# Patient Record
Sex: Male | Born: 1995 | Race: White | Hispanic: No | Marital: Single | Smoking: Never smoker
Health system: Southern US, Community
[De-identification: ages and names within clinical notes are randomized; demographics above are authoritative.]

## PROBLEM LIST (undated history)

## (undated) DIAGNOSIS — I10 Essential (primary) hypertension: Secondary | ICD-10-CM

## (undated) HISTORY — PX: TONSILLECTOMY: SUR1361

## (undated) HISTORY — DX: Essential (primary) hypertension: I10

---
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
## 2021-06-12 ENCOUNTER — Ambulatory Visit: Payer: No Typology Code available for payment source | Admitting: Internal Medicine

## 2021-08-30 ENCOUNTER — Ambulatory Visit: Payer: No Typology Code available for payment source | Admitting: Internal Medicine

## 2021-09-20 ENCOUNTER — Encounter: Payer: Self-pay | Admitting: Internal Medicine

## 2021-09-20 ENCOUNTER — Ambulatory Visit (INDEPENDENT_AMBULATORY_CARE_PROVIDER_SITE_OTHER): Payer: Commercial Managed Care - PPO | Admitting: Internal Medicine

## 2021-09-20 VITALS — BP 156/90 | HR 81 | Resp 18 | Ht 70.0 in | Wt 256.6 lb

## 2021-09-20 DIAGNOSIS — Z1159 Encounter for screening for other viral diseases: Secondary | ICD-10-CM

## 2021-09-20 DIAGNOSIS — E669 Obesity, unspecified: Secondary | ICD-10-CM

## 2021-09-20 DIAGNOSIS — Z0000 Encounter for general adult medical examination without abnormal findings: Secondary | ICD-10-CM

## 2021-09-20 DIAGNOSIS — Z114 Encounter for screening for human immunodeficiency virus [HIV]: Secondary | ICD-10-CM

## 2021-09-20 DIAGNOSIS — I10 Essential (primary) hypertension: Secondary | ICD-10-CM

## 2021-09-20 MED ORDER — LISINOPRIL 20 MG PO TABS: 20.0000 mg | ORAL_TABLET | Freq: Every day | ORAL | 3 refills | Status: DC

## 2021-09-20 NOTE — Progress Notes (Signed)
New Patient Office Visit  Subjective:  Patient ID: Frank Carson, male    DOB: September 06, 1995  Age: 26 y.o. MRN: [id number]  CC:  Chief Complaint  Patient presents with   New Patient (Initial Visit)    New patient has seen dr hall one time just establishing care     HPI Frank Carson is a 26 y.o. male with past medical history of HTN and obesity who presents for establishing care.  HTN: His BP was elevated in the office today. He takes Lisinopril 10 mg QD.  He denies any headache, dizziness, chest pain, dyspnea or palpitations.  He attributes stress to his uncontrolled HTN.  He had a baby around 2 months ago.  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  He also reports taking more than 1 soft drink per day and admits that he needs to cut down.  He has alcohol on weekends only, up to 5 drinks at a time. He has had recent blood tests at his workplace.       Past Medical History:  Diagnosis Date   Hypertension     Past Surgical History:  Procedure Laterality Date   TONSILLECTOMY      Family History  Problem Relation Age of Onset   Hypertension Father    Hypertension Paternal Grandfather    Heart failure Paternal Grandfather     Social History   Socioeconomic History   Marital status: Single    Spouse name: Not on file   Number of children: Not on file   Years of education: Not on file   Highest education level: Not on file  Occupational History   Not on file  Tobacco Use   Smoking status: Never   Smokeless tobacco: Not on file  Substance and Sexual Activity   Alcohol use: No   Drug use: Not on file   Sexual activity: Not on file  Other Topics Concern   Not on file  Social History Narrative   Not on file   Social Determinants of Health   Financial Resource Strain: Not on file  Food Insecurity: Not on file  Transportation Needs: Not on file  Physical Activity: Not on file  Stress: Not on file  Social  Connections: Not on file  Intimate Partner Violence: Not on file    ROS Review of Systems  Constitutional:  Negative for chills and fever.  HENT:  Negative for congestion and sore throat.   Eyes:  Negative for pain and discharge.  Respiratory:  Negative for cough and shortness of breath.   Cardiovascular:  Negative for chest pain and palpitations.  Gastrointestinal:  Negative for constipation, diarrhea, nausea and vomiting.  Endocrine: Negative for polydipsia and polyuria.  Genitourinary:  Negative for dysuria and hematuria.  Musculoskeletal:  Negative for neck pain and neck stiffness.  Skin:  Negative for rash.  Neurological:  Negative for dizziness, weakness, numbness and headaches.  Psychiatric/Behavioral:  Negative for agitation and behavioral problems.     Objective:   Today's Vitals: BP (!) 156/90 (BP Location: Right Arm)   Pulse 81   Resp 18   Ht 5' 10"  (1.778 m)   Wt 256 lb 9.6 oz (116.4 kg)   SpO2 97%   BMI 36.82 kg/m   Physical Exam Vitals reviewed.  Constitutional:      General: He is not in acute distress.    Appearance: He is obese. He is not diaphoretic.  HENT:  Head: Normocephalic and atraumatic.     Nose: Nose normal.     Mouth/Throat:     Mouth: Mucous membranes are moist.  Eyes:     General: No scleral icterus.    Extraocular Movements: Extraocular movements intact.  Cardiovascular:     Rate and Rhythm: Normal rate and regular rhythm.     Pulses: Normal pulses.     Heart sounds: No murmur heard. Pulmonary:     Breath sounds: Normal breath sounds. No wheezing or rales.  Musculoskeletal:     Cervical back: Neck supple. No tenderness.     Right lower leg: No edema.     Left lower leg: No edema.  Skin:    General: Skin is warm.     Findings: No rash.  Neurological:     General: No focal deficit present.     Mental Status: He is alert and oriented to person, place, and time.     Sensory: No sensory deficit.     Motor: No weakness.   Psychiatric:        Mood and Affect: Mood normal.        Behavior: Behavior normal.     Assessment & Plan:   Problem List Items Addressed This Visit       Cardiovascular and Mediastinum   Essential hypertension - Primary    BP Readings from Last 1 Encounters:  09/20/21 (!) 156/90  uncontrolled with Lisinopril 10 mg QD Increased dose of lisinopril to 20 mg QD Counseled for compliance with the medications Advised DASH diet and moderate exercise/walking, at least 150 mins/week Needs to cut down soft drink intake       Relevant Medications   lisinopril (ZESTRIL) 20 MG tablet     Other   Obesity (BMI 30-39.9)    Advised to follow DASH diet and perform moderate exercise/walking at least 150 minutes/week      Other Visit Diagnoses     Annual physical exam       Relevant Orders   TSH   Lipid panel   CMP14+EGFR   CBC with Differential/Platelet   Need for hepatitis C screening test       Relevant Orders   Hepatitis C Antibody   Encounter for screening for HIV       Relevant Orders   HIV antibody (with reflex)       Outpatient Encounter Medications as of 09/20/2021  Medication Sig   [DISCONTINUED] lisinopril (ZESTRIL) 10 MG tablet Take 10 mg by mouth daily.   lisinopril (ZESTRIL) 20 MG tablet Take 1 tablet (20 mg total) by mouth daily.   [DISCONTINUED] Adapalene-Benzoyl Peroxide 0.1-2.5 % gel Apply to affected areas at bedtime after washing   [DISCONTINUED] minocycline (MINOCIN,DYNACIN) 100 MG capsule 2 tabs once PO then 1 tab PO BID (Patient not taking: Reported on 09/20/2021)   No facility-administered encounter medications on file as of 09/20/2021.    Follow-up: Return in about 3 months (around 12/21/2021) for Annual physical.   Lindell Spar, MD

## 2021-09-20 NOTE — Assessment & Plan Note (Signed)
Advised to follow DASH diet and perform moderate exercise/walking at least 150 minutes/week 

## 2021-09-20 NOTE — Patient Instructions (Addendum)
Please start taking Lisinopril 20 mg once daily.  Please follow DASH diet and perform moderate exercise/walking at least 150 mins/week.  Please cut down soft drinks up to 1 every other day for now.  Please get fasting blood tests done before the next visit.

## 2021-09-20 NOTE — Assessment & Plan Note (Signed)
BP Readings from Last 1 Encounters:  09/20/21 (!) 156/90   uncontrolled with Lisinopril 10 mg QD Increased dose of lisinopril to 20 mg QD Counseled for compliance with the medications Advised DASH diet and moderate exercise/walking, at least 150 mins/week Needs to cut down soft drink intake

## 2021-12-22 ENCOUNTER — Ambulatory Visit (INDEPENDENT_AMBULATORY_CARE_PROVIDER_SITE_OTHER): Payer: Commercial Managed Care - PPO | Admitting: Internal Medicine

## 2021-12-22 ENCOUNTER — Telehealth: Payer: Self-pay | Admitting: Internal Medicine

## 2021-12-22 ENCOUNTER — Encounter: Payer: Self-pay | Admitting: Internal Medicine

## 2021-12-22 VITALS — BP 148/88 | HR 78 | Resp 16 | Ht 70.0 in | Wt 262.0 lb

## 2021-12-22 DIAGNOSIS — R5382 Chronic fatigue, unspecified: Secondary | ICD-10-CM | POA: Diagnosis not present

## 2021-12-22 DIAGNOSIS — Z0001 Encounter for general adult medical examination with abnormal findings: Secondary | ICD-10-CM | POA: Insufficient documentation

## 2021-12-22 DIAGNOSIS — I10 Essential (primary) hypertension: Secondary | ICD-10-CM | POA: Diagnosis not present

## 2021-12-22 DIAGNOSIS — G4733 Obstructive sleep apnea (adult) (pediatric): Secondary | ICD-10-CM

## 2021-12-22 MED ORDER — LISINOPRIL 30 MG PO TABS: 30.0000 mg | ORAL_TABLET | Freq: Every day | ORAL | 3 refills | Status: DC

## 2021-12-22 NOTE — Assessment & Plan Note (Signed)
Could be due to multiple factors, including lack of sleep, OSA or hormonal or nutritional deficiency Check CBC, CMP, TSH, testosterone level 

## 2021-12-22 NOTE — Assessment & Plan Note (Signed)
BP Readings from Last 1 Encounters:  12/22/21 (!) 148/88   uncontrolled with Lisinopril 20 mg QD Increased dose of lisinopril to 30 mg QD Counseled for compliance with the medications Advised DASH diet and moderate exercise/walking, at least 150 mins/week Needs to cut down soft drink intake

## 2021-12-22 NOTE — Telephone Encounter (Signed)
Pt needs a letter stating he was seen here today for his physical faxed to his insurance. Can you please write letter?     [employer], Fax # [phone]

## 2021-12-22 NOTE — Assessment & Plan Note (Signed)
STOP-BANG: 6 At high risk for OSA Check home sleep study 

## 2021-12-22 NOTE — Patient Instructions (Signed)
Please start taking Lisinopril 30 mg instead of 20 mg.  Please continue to follow low salt diet and perform moderate exercise/walking at least 150 mins/week.  Please get fasting blood tests done within a week.

## 2021-12-22 NOTE — Progress Notes (Signed)
Established Patient Office Visit  Subjective:  Patient ID: Frank Carson, male    DOB: 1995/06/11  Age: 26 y.o. MRN: [id number]  CC:  Chief Complaint  Patient presents with   Annual Exam    Wants to get his testosterone level checked     HPI Frank Carson is a 26 y.o. male with past medical history of HTN and obesity who presents for annual physical.  HTN: His BP was elevated in the office today. He takes Lisinopril 20 mg QD.  He denies any headache, dizziness, chest pain, dyspnea or palpitations.  Chronic fatigue: He complains of chronic fatigue and daytime drowsiness.  He reports snoring at nighttime. He attributes stress to his uncontrolled HTN.  He had a baby around 5 months ago.  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  He also reports lack of sexual drive and asks for testosterone level check.  Denies any fever, chills, chronic cough, dyspnea, night sweats, weight loss, dysuria or hematuria.    Past Medical History:  Diagnosis Date   Hypertension     Past Surgical History:  Procedure Laterality Date   TONSILLECTOMY      Family History  Problem Relation Age of Onset   Hypertension Father    Hypertension Paternal Grandfather    Heart failure Paternal Grandfather     Social History   Socioeconomic History   Marital status: Single    Spouse name: Not on file   Number of children: Not on file   Years of education: Not on file   Highest education level: Not on file  Occupational History   Not on file  Tobacco Use   Smoking status: Never   Smokeless tobacco: Not on file  Substance and Sexual Activity   Alcohol use: No   Drug use: Not on file   Sexual activity: Not on file  Other Topics Concern   Not on file  Social History Narrative   Not on file   Social Determinants of Health   Financial Resource Strain: Not on file  Food Insecurity: Not on file  Transportation Needs: Not on file  Physical  Activity: Not on file  Stress: Not on file  Social Connections: Not on file  Intimate Partner Violence: Not on file    Outpatient Medications Prior to Visit  Medication Sig Dispense Refill   lisinopril (ZESTRIL) 20 MG tablet Take 1 tablet (20 mg total) by mouth daily. 30 tablet 3   No facility-administered medications prior to visit.    No Known Allergies  ROS Review of Systems  Constitutional:  Positive for fatigue. Negative for chills and fever.  HENT:  Negative for congestion and sore throat.   Eyes:  Negative for pain and discharge.  Respiratory:  Negative for cough and shortness of breath.   Cardiovascular:  Negative for chest pain and palpitations.  Gastrointestinal:  Negative for constipation, diarrhea, nausea and vomiting.  Endocrine: Negative for polydipsia and polyuria.  Genitourinary:  Negative for dysuria and hematuria.  Musculoskeletal:  Negative for neck pain and neck stiffness.  Skin:  Negative for rash.  Neurological:  Negative for dizziness, weakness, numbness and headaches.  Psychiatric/Behavioral:  Negative for agitation and behavioral problems.       Objective:    Physical Exam Vitals reviewed.  Constitutional:      General: He is not in acute distress.    Appearance: He is obese. He is not diaphoretic.  HENT:  Head: Normocephalic and atraumatic.     Nose: Nose normal.     Mouth/Throat:     Mouth: Mucous membranes are moist.  Eyes:     General: No scleral icterus.    Extraocular Movements: Extraocular movements intact.  Cardiovascular:     Rate and Rhythm: Normal rate and regular rhythm.     Pulses: Normal pulses.     Heart sounds: No murmur heard. Pulmonary:     Breath sounds: Normal breath sounds. No wheezing or rales.  Abdominal:     Palpations: Abdomen is soft.     Tenderness: There is no abdominal tenderness.  Musculoskeletal:     Cervical back: Neck supple. No tenderness.     Right lower leg: No edema.     Left lower leg: No  edema.  Skin:    General: Skin is warm.     Findings: No rash.  Neurological:     General: No focal deficit present.     Mental Status: He is alert and oriented to person, place, and time.     Sensory: No sensory deficit.     Motor: No weakness.  Psychiatric:        Mood and Affect: Mood normal.        Behavior: Behavior normal.     BP (!) 148/88 (BP Location: Left Arm, Cuff Size: Normal)   Pulse 78   Resp 16   Ht 5' 10"  (1.778 m)   Wt 262 lb (118.8 kg)   SpO2 98%   BMI 37.59 kg/m  Wt Readings from Last 3 Encounters:  12/22/21 262 lb (118.8 kg)  09/20/21 256 lb 9.6 oz (116.4 kg)  08/07/12 185 lb (83.9 kg) (98 %, Z= 2.05)*   * Growth percentiles are based on CDC (Boys, 2-20 Years) data.    No results found for: "TSH" No results found for: "WBC", "HGB", "HCT", "MCV", "PLT" No results found for: "NA", "K", "CHLORIDE", "CO2", "GLUCOSE", "BUN", "CREATININE", "BILITOT", "ALKPHOS", "AST", "ALT", "PROT", "ALBUMIN", "CALCIUM", "ANIONGAP", "EGFR", "GFR" No results found for: "CHOL" No results found for: "HDL" No results found for: "LDLCALC" No results found for: "TRIG" No results found for: "CHOLHDL" No results found for: "HGBA1C"    Assessment & Plan:   Problem List Items Addressed This Visit       Cardiovascular and Mediastinum   Essential hypertension    BP Readings from Last 1 Encounters:  12/22/21 (!) 148/88  uncontrolled with Lisinopril 20 mg QD Increased dose of lisinopril to 30 mg QD Counseled for compliance with the medications Advised DASH diet and moderate exercise/walking, at least 150 mins/week Needs to cut down soft drink intake      Relevant Medications   lisinopril (ZESTRIL) 30 MG tablet     Respiratory   OSA (obstructive sleep apnea)    STOP BANG: 6 At high risk for OSA Check home sleep study      Relevant Orders   Home sleep test     Other   Encounter for general adult medical examination with abnormal findings - Primary    Physical  exam as documented. Fasting blood tests ordered. Refused flu vaccine. Tdap vaccine at workplace.      Chronic fatigue    Could be due to multiple factors, including lack of sleep, OSA or hormonal or nutritional deficiency Check CBC, CMP, TSH, testosterone level      Relevant Orders   Testosterone,Free and Total    Meds ordered this encounter  Medications   lisinopril (  ZESTRIL) 30 MG tablet    Sig: Take 1 tablet (30 mg total) by mouth daily.    Dispense:  30 tablet    Refill:  3    Follow-up: Return in about 4 months (around 04/23/2022) for HTN.    Lindell Spar, MD

## 2021-12-22 NOTE — Assessment & Plan Note (Signed)
Physical exam as documented. Fasting blood tests ordered. Refused flu vaccine. Tdap vaccine at workplace.

## 2021-12-25 NOTE — Telephone Encounter (Signed)
There is a form from [employer] that he will need to bring for [place] to fill out. Employer or insurance company should be able to provide him with this

## 2022-04-24 ENCOUNTER — Ambulatory Visit: Payer: Commercial Managed Care - PPO | Admitting: Internal Medicine

## 2022-05-16 ENCOUNTER — Ambulatory Visit: Payer: Commercial Managed Care - PPO | Admitting: Internal Medicine

## 2022-05-16 ENCOUNTER — Encounter: Payer: Self-pay | Admitting: Internal Medicine

## 2022-05-30 ENCOUNTER — Other Ambulatory Visit: Payer: Self-pay

## 2022-05-30 DIAGNOSIS — I10 Essential (primary) hypertension: Secondary | ICD-10-CM

## 2022-05-30 MED ORDER — LISINOPRIL 30 MG PO TABS: 30.0000 mg | ORAL_TABLET | Freq: Every day | ORAL | 1 refills | Status: DC

## 2022-06-04 ENCOUNTER — Encounter: Payer: Self-pay | Admitting: Internal Medicine

## 2022-06-04 ENCOUNTER — Ambulatory Visit (INDEPENDENT_AMBULATORY_CARE_PROVIDER_SITE_OTHER): Payer: Commercial Managed Care - PPO | Admitting: Internal Medicine

## 2022-06-04 VITALS — BP 131/77 | HR 89 | Ht 70.0 in | Wt 253.2 lb

## 2022-06-04 DIAGNOSIS — I10 Essential (primary) hypertension: Secondary | ICD-10-CM

## 2022-06-04 DIAGNOSIS — G4733 Obstructive sleep apnea (adult) (pediatric): Secondary | ICD-10-CM | POA: Diagnosis not present

## 2022-06-04 DIAGNOSIS — R739 Hyperglycemia, unspecified: Secondary | ICD-10-CM

## 2022-06-04 DIAGNOSIS — R5382 Chronic fatigue, unspecified: Secondary | ICD-10-CM | POA: Diagnosis not present

## 2022-06-04 DIAGNOSIS — E782 Mixed hyperlipidemia: Secondary | ICD-10-CM

## 2022-06-04 DIAGNOSIS — Z1159 Encounter for screening for other viral diseases: Secondary | ICD-10-CM

## 2022-06-04 DIAGNOSIS — R6882 Decreased libido: Secondary | ICD-10-CM

## 2022-06-04 DIAGNOSIS — E669 Obesity, unspecified: Secondary | ICD-10-CM | POA: Diagnosis not present

## 2022-06-04 MED ORDER — LISINOPRIL 30 MG PO TABS: 30.0000 mg | ORAL_TABLET | Freq: Every day | ORAL | 1 refills | Status: DC

## 2022-06-04 NOTE — Assessment & Plan Note (Signed)
Could be due to multiple factors, including lack of sleep, OSA or hormonal or nutritional deficiency Check CBC, CMP, TSH, testosterone level

## 2022-06-04 NOTE — Patient Instructions (Signed)
Please continue to take medications as prescribed. ? ?Please continue to follow low carb diet and perform moderate exercise/walking at least 150 mins/week. ?

## 2022-06-04 NOTE — Assessment & Plan Note (Addendum)
BP Readings from Last 1 Encounters:  06/04/22 131/77   Well-controlled with Lisinopril 30 mg QD Counseled for compliance with the medications Advised DASH diet and moderate exercise/walking, at least 150 mins/week Has cut down soft drink intake

## 2022-06-04 NOTE — Progress Notes (Signed)
Established Patient Office Visit  Subjective:  Patient ID: Frank Carson, male    DOB: 12/23/1995  Age: 27 y.o. MRN: [id number]  CC:  Chief Complaint  Patient presents with   Hypertension    Four month follow up    HPI Frank Carson is a 27 y.o. male with past medical history of HTN and obesity who presents for f/u of his chronic medical conditions.  HTN: BP is well-controlled. Takes medications regularly. Patient denies headache, dizziness, chest pain, dyspnea or palpitations.  Chronic fatigue: He complains of chronic fatigue and daytime drowsiness.  He reports snoring at nighttime. He attributes stress to his uncontrolled HTN. 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  He also reports lack of sexual drive and asks for testosterone level check.  Denies any fever, chills, chronic cough, dyspnea, night sweats, weight loss, dysuria or hematuria. He has not had blood tests done.    Past Medical History:  Diagnosis Date   Hypertension     Past Surgical History:  Procedure Laterality Date   TONSILLECTOMY      Family History  Problem Relation Age of Onset   Hypertension Father    Hypertension Paternal Grandfather    Heart failure Paternal Grandfather     Social History   Socioeconomic History   Marital status: Single    Spouse name: Not on file   Number of children: Not on file   Years of education: Not on file   Highest education level: Not on file  Occupational History   Not on file  Tobacco Use   Smoking status: Never   Smokeless tobacco: Not on file  Substance and Sexual Activity   Alcohol use: No   Drug use: Not on file   Sexual activity: Not on file  Other Topics Concern   Not on file  Social History Narrative   Not on file   Social Determinants of Health   Financial Resource Strain: Not on file  Food Insecurity: Not on file  Transportation Needs: Not on file  Physical Activity: Not on file   Stress: Not on file  Social Connections: Not on file  Intimate Partner Violence: Not on file    Outpatient Medications Prior to Visit  Medication Sig Dispense Refill   lisinopril (ZESTRIL) 30 MG tablet Take 1 tablet (30 mg total) by mouth daily. 30 tablet 1   No facility-administered medications prior to visit.    No Known Allergies  ROS Review of Systems  Constitutional:  Positive for fatigue. Negative for chills and fever.  HENT:  Negative for congestion and sore throat.   Eyes:  Negative for pain and discharge.  Respiratory:  Negative for cough and shortness of breath.   Cardiovascular:  Negative for chest pain and palpitations.  Gastrointestinal:  Negative for constipation, diarrhea, nausea and vomiting.  Endocrine: Negative for polydipsia and polyuria.  Genitourinary:  Negative for dysuria and hematuria.  Musculoskeletal:  Negative for neck pain and neck stiffness.  Skin:  Negative for rash.  Neurological:  Negative for dizziness, weakness, numbness and headaches.  Psychiatric/Behavioral:  Negative for agitation and behavioral problems.       Objective:    Physical Exam Vitals reviewed.  Constitutional:      General: He is not in acute distress.    Appearance: He is obese. He is not diaphoretic.  HENT:     Head: Normocephalic and atraumatic.     Nose: Nose normal.  Mouth/Throat:     Mouth: Mucous membranes are moist.  Eyes:     General: No scleral icterus.    Extraocular Movements: Extraocular movements intact.  Cardiovascular:     Rate and Rhythm: Normal rate and regular rhythm.     Pulses: Normal pulses.     Heart sounds: No murmur heard. Pulmonary:     Breath sounds: Normal breath sounds. No wheezing or rales.  Musculoskeletal:     Cervical back: Neck supple. No tenderness.     Right lower leg: No edema.     Left lower leg: No edema.  Skin:    General: Skin is warm.     Findings: No rash.  Neurological:     General: No focal deficit present.      Mental Status: He is alert and oriented to person, place, and time.     Sensory: No sensory deficit.     Motor: No weakness.  Psychiatric:        Mood and Affect: Mood normal.        Behavior: Behavior normal.     BP 131/77 (BP Location: Left Arm, Patient Position: Sitting, Cuff Size: Large)   Pulse 89   Ht '5\' 10"'$  (1.778 m)   Wt 253 lb 3.2 oz (114.9 kg)   SpO2 98%   BMI 36.33 kg/m  Wt Readings from Last 3 Encounters:  06/04/22 253 lb 3.2 oz (114.9 kg)  12/22/21 262 lb (118.8 kg)  09/20/21 256 lb 9.6 oz (116.4 kg)    No results found for: "TSH" No results found for: "WBC", "HGB", "HCT", "MCV", "PLT" No results found for: "NA", "K", "CHLORIDE", "CO2", "GLUCOSE", "BUN", "CREATININE", "BILITOT", "ALKPHOS", "AST", "ALT", "PROT", "ALBUMIN", "CALCIUM", "ANIONGAP", "EGFR", "GFR" No results found for: "CHOL" No results found for: "HDL" No results found for: "LDLCALC" No results found for: "TRIG" No results found for: "CHOLHDL" No results found for: "HGBA1C"    Assessment & Plan:   Problem List Items Addressed This Visit       Cardiovascular and Mediastinum   Essential hypertension - Primary    BP Readings from Last 1 Encounters:  06/04/22 131/77  Well-controlled with Lisinopril 30 mg QD Counseled for compliance with the medications Advised DASH diet and moderate exercise/walking, at least 150 mins/week Has cut down soft drink intake      Relevant Medications   lisinopril (ZESTRIL) 30 MG tablet   Other Relevant Orders   TSH   CMP14+EGFR   CBC with Differential/Platelet     Respiratory   OSA (obstructive sleep apnea)    STOP BANG: 6 At high risk for OSA Check home sleep study      Relevant Orders   Home sleep test     Other   Obesity (BMI 30-39.9)    Advised to follow DASH diet and perform moderate exercise/walking at least 150 minutes/week Has cut down soft drink intake - lost about 9 lbs since the last visit      Chronic fatigue    Could be due to  multiple factors, including lack of sleep, OSA or hormonal or nutritional deficiency Check CBC, CMP, TSH, testosterone level      Relevant Orders   TSH   Testosterone,Free and Total   Other Visit Diagnoses     Mixed hyperlipidemia       Relevant Medications   lisinopril (ZESTRIL) 30 MG tablet   Other Relevant Orders   Lipid panel   Hyperglycemia       Relevant Orders  Hemoglobin A1c   Need for hepatitis C screening test       Relevant Orders   Hepatitis C Antibody   Loss of libido       Relevant Orders   Testosterone,Free and Total       Meds ordered this encounter  Medications   lisinopril (ZESTRIL) 30 MG tablet    Sig: Take 1 tablet (30 mg total) by mouth daily.    Dispense:  90 tablet    Refill:  1    Follow-up: Return in about 6 months (around 12/05/2022) for Annual physical.    Lindell Spar, MD

## 2022-06-04 NOTE — Assessment & Plan Note (Signed)
STOP-BANG: 6 At high risk for OSA Check home sleep study 

## 2022-06-04 NOTE — Assessment & Plan Note (Signed)
Advised to follow DASH diet and perform moderate exercise/walking at least 150 minutes/week Has cut down soft drink intake - lost about 9 lbs since the last visit

## 2022-12-26 ENCOUNTER — Encounter: Payer: Self-pay | Admitting: Internal Medicine

## 2022-12-26 ENCOUNTER — Ambulatory Visit (INDEPENDENT_AMBULATORY_CARE_PROVIDER_SITE_OTHER): Payer: Commercial Managed Care - PPO | Admitting: Internal Medicine

## 2022-12-26 VITALS — BP 124/76 | HR 70 | Ht 70.0 in | Wt 239.8 lb

## 2022-12-26 DIAGNOSIS — Z0001 Encounter for general adult medical examination with abnormal findings: Secondary | ICD-10-CM

## 2022-12-26 DIAGNOSIS — I10 Essential (primary) hypertension: Secondary | ICD-10-CM | POA: Diagnosis not present

## 2022-12-26 DIAGNOSIS — R5382 Chronic fatigue, unspecified: Secondary | ICD-10-CM | POA: Diagnosis not present

## 2022-12-26 DIAGNOSIS — L732 Hidradenitis suppurativa: Secondary | ICD-10-CM | POA: Insufficient documentation

## 2022-12-26 DIAGNOSIS — G4733 Obstructive sleep apnea (adult) (pediatric): Secondary | ICD-10-CM | POA: Diagnosis not present

## 2022-12-26 MED ORDER — CLINDAMYCIN PHOSPHATE 1 % EX GEL: Freq: Two times a day (BID) | CUTANEOUS | 0 refills | Status: AC

## 2022-12-26 MED ORDER — CLINDAMYCIN PHOSPHATE 1 % EX GEL: Freq: Two times a day (BID) | CUTANEOUS | 0 refills | Status: DC

## 2022-12-26 MED ORDER — LISINOPRIL 30 MG PO TABS: 30.0000 mg | ORAL_TABLET | Freq: Every day | ORAL | 1 refills | Status: DC

## 2022-12-26 NOTE — Addendum Note (Signed)
Addended byTrena Platt on: 12/26/2022 05:45 PM   Modules accepted: Orders

## 2022-12-26 NOTE — Assessment & Plan Note (Signed)
BP Readings from Last 1 Encounters:  12/26/22 124/76   Well-controlled with Lisinopril 30 mg QD Counseled for compliance with the medications Advised DASH diet and moderate exercise/walking, at least 150 mins/week Has cut down soft drink intake

## 2022-12-26 NOTE — Assessment & Plan Note (Signed)
Physical exam as documented. Fasting blood tests ordered. Refused flu vaccine. Tdap vaccine at workplace.

## 2022-12-26 NOTE — Assessment & Plan Note (Signed)
Could be due to multiple factors, including lack of sleep, OSA or hormonal or nutritional deficiency Check CBC, CMP, TSH, testosterone level - needs to obtain records from his workplace clinic

## 2022-12-26 NOTE — Progress Notes (Signed)
Established Patient Office Visit  Subjective:  Patient ID: Frank Carson, male    DOB: [birth year]  Age: 27 y.o. MRN: [id number]  CC:  Chief Complaint  Patient presents with   Annual Exam    HPI Frank Carson is a 27 y.o. male with past medical history of HTN and obesity who presents for annual physical.  HTN: His BP was wnl in the office today. He takes Lisinopril 30 mg QD.  He denies any headache, dizziness, chest pain, dyspnea or palpitations.  Chronic fatigue: He complains of chronic fatigue and daytime drowsiness.  He reports snoring at nighttime. 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  He also reports lack of sexual drive and asks for testosterone level check. He had blood test done at his workplace clinic, but are not available for review yet.  He has not had sleep study yet.  Denies any fever, chills, chronic cough, dyspnea, night sweats, weight loss, dysuria or hematuria.  Skin bumps: He reports having recurrent skin bumps in the waist area and groin area. He has red and erupted bumps, which are itchy.  Past Medical History:  Diagnosis Date   Hypertension     Past Surgical History:  Procedure Laterality Date   TONSILLECTOMY      Family History  Problem Relation Age of Onset   Hypertension Father    Hypertension Paternal Grandfather    Heart failure Paternal Grandfather     Social History   Socioeconomic History   Marital status: Single    Spouse name: Not on file   Number of children: Not on file   Years of education: Not on file   Highest education level: Not on file  Occupational History   Not on file  Tobacco Use   Smoking status: Never   Smokeless tobacco: Not on file  Substance and Sexual Activity   Alcohol use: No   Drug use: Not on file   Sexual activity: Not on file  Other Topics Concern   Not on file  Social History Narrative   Not on file   Social Determinants of Health   Financial  Resource Strain: Not on file  Food Insecurity: Not on file  Transportation Needs: Not on file  Physical Activity: Not on file  Stress: Not on file  Social Connections: Not on file  Intimate Partner Violence: Not on file    Outpatient Medications Prior to Visit  Medication Sig Dispense Refill   lisinopril (ZESTRIL) 30 MG tablet Take 1 tablet (30 mg total) by mouth daily. 90 tablet 1   No facility-administered medications prior to visit.    No Known Allergies  ROS Review of Systems  Constitutional:  Positive for fatigue. Negative for chills and fever.  HENT:  Negative for congestion and sore throat.   Eyes:  Negative for pain and discharge.  Respiratory:  Negative for cough and shortness of breath.   Cardiovascular:  Negative for chest pain and palpitations.  Gastrointestinal:  Negative for constipation, diarrhea, nausea and vomiting.  Endocrine: Negative for polydipsia and polyuria.  Genitourinary:  Negative for dysuria and hematuria.  Musculoskeletal:  Negative for neck pain and neck stiffness.  Skin:  Positive for rash.  Neurological:  Negative for dizziness, weakness, numbness and headaches.  Psychiatric/Behavioral:  Negative for agitation and behavioral problems.       Objective:    Physical Exam Vitals reviewed.  Constitutional:      General: He is not  in acute distress.    Appearance: He is obese. He is not diaphoretic.  HENT:     Head: Normocephalic and atraumatic.     Nose: Nose normal.     Mouth/Throat:     Mouth: Mucous membranes are moist.  Eyes:     General: No scleral icterus.    Extraocular Movements: Extraocular movements intact.  Cardiovascular:     Rate and Rhythm: Normal rate and regular rhythm.     Pulses: Normal pulses.     Heart sounds: No murmur heard. Pulmonary:     Breath sounds: Normal breath sounds. No wheezing or rales.  Abdominal:     Palpations: Abdomen is soft.     Tenderness: There is no abdominal tenderness.  Musculoskeletal:      Cervical back: Neck supple. No tenderness.     Right lower leg: No edema.     Left lower leg: No edema.  Skin:    General: Skin is warm.     Findings: Rash (Patient reported erythematous papules in waist and groin area-recurrent, not currently present) present.  Neurological:     General: No focal deficit present.     Mental Status: He is alert and oriented to person, place, and time.     Sensory: No sensory deficit.     Motor: No weakness.  Psychiatric:        Mood and Affect: Mood normal.        Behavior: Behavior normal.     BP 124/76 (BP Location: Right Arm, Patient Position: Sitting, Cuff Size: Normal)   Pulse 70   Ht 5\' 10"  (1.778 m)   Wt 239 lb 12.8 oz (108.8 kg)   SpO2 98%   BMI 34.41 kg/m  Wt Readings from Last 3 Encounters:  12/26/22 239 lb 12.8 oz (108.8 kg)  06/04/22 253 lb 3.2 oz (114.9 kg)  12/22/21 262 lb (118.8 kg)    No results found for: "TSH" No results found for: "WBC", "HGB", "HCT", "MCV", "PLT" No results found for: "NA", "K", "CHLORIDE", "CO2", "GLUCOSE", "BUN", "CREATININE", "BILITOT", "ALKPHOS", "AST", "ALT", "PROT", "ALBUMIN", "CALCIUM", "ANIONGAP", "EGFR", "GFR" No results found for: "CHOL" No results found for: "HDL" No results found for: "LDLCALC" No results found for: "TRIG" No results found for: "CHOLHDL" No results found for: "HGBA1C"    Assessment & Plan:   Problem List Items Addressed This Visit       Cardiovascular and Mediastinum   Essential hypertension    BP Readings from Last 1 Encounters:  12/26/22 124/76   Well-controlled with Lisinopril 30 mg QD Counseled for compliance with the medications Advised DASH diet and moderate exercise/walking, at least 150 mins/week Has cut down soft drink intake      Relevant Medications   lisinopril (ZESTRIL) 30 MG tablet   Other Relevant Orders   CMP14+EGFR   TSH + free T4     Respiratory   OSA (obstructive sleep apnea)    STOP BANG: 6 At high risk for OSA Check home sleep  study - will check with [facility] again        Musculoskeletal and Integument   Hidradenitis suppurativa    Recurrent bumps in waist and groin area likely hydradenitis suppurativa Clindamycin gel prescribed      Relevant Medications   clindamycin (CLINDAGEL) 1 % gel     Other   Encounter for general adult medical examination with abnormal findings - Primary    Physical exam as documented. Fasting blood tests ordered. Refused flu vaccine.  Tdap vaccine at workplace.      Chronic fatigue    Could be due to multiple factors, including lack of sleep, OSA or hormonal or nutritional deficiency Check CBC, CMP, TSH, testosterone level - needs to obtain records from his workplace clinic      Relevant Orders   TSH + free T4    Meds ordered this encounter  Medications   DISCONTD: clindamycin (CLINDAGEL) 1 % gel    Sig: Apply topically 2 (two) times daily.    Dispense:  30 g    Refill:  0   clindamycin (CLINDAGEL) 1 % gel    Sig: Apply topically 2 (two) times daily for 15 days.    Dispense:  30 g    Refill:  0   lisinopril (ZESTRIL) 30 MG tablet    Sig: Take 1 tablet (30 mg total) by mouth daily.    Dispense:  90 tablet    Refill:  1    Follow-up: Return in about 6 months (around 06/26/2023) for HTN.    Anabel Halon, MD

## 2022-12-26 NOTE — Assessment & Plan Note (Signed)
STOP BANG: 6 At high risk for OSA Check home sleep study - will check with [facility] again

## 2022-12-26 NOTE — Patient Instructions (Addendum)
Please continue to take medications as prescribed.  Please continue to follow low salt diet and perform moderate exercise/walking at least 150 mins/week.  Please get blood tests done within a month.

## 2022-12-26 NOTE — Assessment & Plan Note (Signed)
Recurrent bumps in waist and groin area likely hydradenitis suppurativa Clindamycin gel prescribed

## 2023-07-02 ENCOUNTER — Ambulatory Visit: Payer: Commercial Managed Care - PPO | Admitting: Internal Medicine

## 2023-07-19 ENCOUNTER — Ambulatory Visit (HOSPITAL_COMMUNITY)
Admission: RE | Admit: 2023-07-19 | Discharge: 2023-07-19 | Disposition: A | Discharge status: Home / Self Care | Source: Ambulatory Visit | Attending: Family Medicine | Admitting: Family Medicine

## 2023-07-19 ENCOUNTER — Encounter: Payer: Self-pay | Admitting: Family Medicine

## 2023-07-19 ENCOUNTER — Ambulatory Visit: Payer: Self-pay

## 2023-07-19 ENCOUNTER — Ambulatory Visit: Admitting: Family Medicine

## 2023-07-19 VITALS — BP 125/76 | HR 74 | Ht 70.0 in | Wt 252.1 lb

## 2023-07-19 DIAGNOSIS — M79672 Pain in left foot: Secondary | ICD-10-CM

## 2023-07-19 MED ORDER — MELOXICAM 15 MG PO TABS: 15.0000 mg | ORAL_TABLET | Freq: Every day | ORAL | 0 refills | Status: AC

## 2023-07-19 MED ORDER — KETOROLAC TROMETHAMINE 60 MG/2ML IM SOLN: 60.0000 mg | Freq: Once | INTRAMUSCULAR | Status: AC

## 2023-07-19 NOTE — Patient Instructions (Signed)

## 2023-07-19 NOTE — Assessment & Plan Note (Signed)
 Xray ordered awaiting results Mobic 15 mg PRN  Advise icing, stretching and modifying activities that cause pain. NSAIDs for pain management. Discussed theThe RICE method: R: Rest the painful area for a few days. I: Ice the area for 20 minutes at a time to relieve inflammation. C: Compress the area with a soft wrap to reduce swelling. E: Elevate the area by putting the foot on a few pillows.

## 2023-07-19 NOTE — Progress Notes (Signed)
 Established Patient Office Visit   Subjective  Patient ID: Frank Carson, male    DOB: Jan 10, 1996  Age: 28 y.o. MRN: [id number]  Chief Complaint  Patient presents with   Foot Swelling    Left foot Pain and swelling x 3 days . Pt states the pain is near the outside of his foot. Causing issues with wt. Bearing as well as mobility.  no injury reported.      He  has a past medical history of Hypertension.  Left Foot Pain The patient presents with left foot pain of unclear etiology, with no identifiable mechanism of injury. The pain is described as stabbing, cramping, and aching, with a severity rated at 10 out of 10. The symptoms have progressively worsened since onset and are characterized as severe. Associated findings include inability to bear weight and muscle weakness. The patient denies numbness, tingling, or the presence of any foreign bodies. The pain is exacerbated by movement, palpation, and weight-bearing activities. He has attempted conservative measures including ice, rest, non-weight bearing, and NSAIDs, all of which provided no relief.    Review of Systems  Constitutional:  Negative for chills and fever.  Respiratory:  Negative for shortness of breath.   Cardiovascular:  Negative for chest pain.  Musculoskeletal:  Positive for joint pain and myalgias.       Left foot pain  Neurological:  Negative for dizziness, tingling and headaches.      Objective:     BP 125/76   Pulse 74   Ht 5\' 10"  (1.778 m)   Wt 252 lb 1.9 oz (114.4 kg)   SpO2 (!) 77%   BMI 36.18 kg/m  BP Readings from Last 3 Encounters:  07/19/23 125/76  12/26/22 124/76  06/04/22 131/77      Physical Exam Vitals reviewed.  Constitutional:      General: He is not in acute distress.    Appearance: Normal appearance. He is not ill-appearing, toxic-appearing or diaphoretic.  HENT:     Head: Normocephalic.  Eyes:     General:        Right eye: No discharge.        Left eye: No discharge.      Conjunctiva/sclera: Conjunctivae normal.  Cardiovascular:     Rate and Rhythm: Normal rate.     Pulses: Normal pulses.     Heart sounds: Normal heart sounds.  Pulmonary:     Effort: Pulmonary effort is normal. No respiratory distress.     Breath sounds: Normal breath sounds.  Musculoskeletal:     Right foot: Normal.     Left foot: Decreased range of motion. Tenderness present. No swelling or foot drop.  Skin:    Capillary Refill: Capillary refill takes less than 2 seconds.  Neurological:     Mental Status: He is alert.  Psychiatric:        Mood and Affect: Mood normal.      No results found for any visits on 07/19/23.  The ASCVD Risk score (Arnett DK, et al., 2019) failed to calculate for the following reasons:   The 2019 ASCVD risk score is only valid for ages 71 to 4    Assessment & Plan:  Left foot pain Assessment & Plan: Xray ordered awaiting results Mobic  15 mg PRN  Advise icing, stretching and modifying activities that cause pain. NSAIDs for pain management. Discussed theThe RICE method: R: Rest the painful area for a few days. I: Ice the area for 20 minutes at a time  to relieve inflammation. C: Compress the area with a soft wrap to reduce swelling. E: Elevate the area by putting the foot on a few pillows.   Orders: -     DG Foot Complete Left; Future -     Ketorolac Tromethamine -     Meloxicam; Take 1 tablet (15 mg total) by mouth daily.  Dispense: 30 tablet; Refill: 0 -     Ambulatory referral to Podiatry    Return if symptoms worsen or fail to improve.   Avelino Lek Amber Bail, FNP

## 2023-07-19 NOTE — Telephone Encounter (Signed)
 Copied from CRM [id number]. Topic: Clinical - Red Word Triage >> Jul 19, 2023  9:44 AM Opal Bill wrote: Red Word that prompted transfer to Nurse Triage: Has "great" pain in his left foot, its excruciating. Cannot walk and get out of bed. The pain comes when he puts weight on the outside of foot. Wants to know if he can come in and get something for it. Started last weekend.   Chief Complaint: Foot pain Symptoms: Right foot pain Frequency: Constant  Pertinent Negatives: Patient denies any known injury  Disposition: [] ED /[] Urgent Care (no appt availability in office) / [x] Appointment(In office/virtual)/ []  [facility]/ [] Home Care/ [] Refused Recommended Disposition /[]  Mobile Bus/ []  Follow-up with PCP Additional Notes: Patient reports waking up with right knee pain this last weekend. He states his pain has been constant and is worsening. He denies any known injury or cause for his pain. Appointment made for the patient today for evaluation of his symptoms.     Reason for Disposition  [1] MODERATE pain (e.g., interferes with normal activities, limping) AND [2] present > 3 days  Answer Assessment - Initial Assessment Questions 1. ONSET: "When did the pain start?"      1 week ago  2. LOCATION: "Where is the pain located?"      Right foot 3. PAIN: "How bad is the pain?"    (Scale 1-10; or mild, moderate, severe)  - MILD (1-3): doesn't interfere with normal activities.   - MODERATE (4-7): interferes with normal activities (e.g., work or school) or awakens from sleep, limping.   - SEVERE (8-10): excruciating pain, unable to do any normal activities, unable to walk.      Moderate to severe  4. WORK OR EXERCISE: "Has there been any recent work or exercise that involved this part of the body?"      No 5. CAUSE: "What do you think is causing the foot pain?"     Unsure  6. OTHER SYMPTOMS: "Do you have any other symptoms?" (e.g., leg pain, rash, fever, numbness)      No  Protocols used: Foot Pain-A-AH

## 2023-07-22 ENCOUNTER — Telehealth: Payer: Self-pay | Admitting: Internal Medicine

## 2023-07-22 NOTE — Telephone Encounter (Signed)
 Patient called asking about the xray report. concern if fractured? Still having same pain.

## 2023-07-22 NOTE — Telephone Encounter (Signed)
 Patient called back to our office after speaking the radiologist at [facility] radiology department said they do not have no one to read the xray at [place], asked for our office to expedite and call  radiologist. For xray of his foot.

## 2023-07-22 NOTE — Telephone Encounter (Signed)
 Seen on 04/25 by Rogerio Clay.

## 2023-07-23 NOTE — Telephone Encounter (Signed)
Pt informed

## 2023-08-26 ENCOUNTER — Encounter: Payer: Self-pay | Admitting: Internal Medicine

## 2023-09-13 ENCOUNTER — Ambulatory Visit: Admitting: Internal Medicine

## 2023-10-03 ENCOUNTER — Other Ambulatory Visit: Payer: Self-pay | Admitting: Internal Medicine

## 2023-10-03 DIAGNOSIS — I10 Essential (primary) hypertension: Secondary | ICD-10-CM

## 2023-10-03 NOTE — Telephone Encounter (Signed)
 Copied from CRM [id number]. Topic: Clinical - Medication Refill >> Oct 03, 2023 12:37 PM Zebedee SAUNDERS wrote: Medication: lisinopril  (ZESTRIL ) 30 MG tablet  Has the patient contacted their pharmacy? Yes (Agent: If no, request that the patient contact the pharmacy for the refill. If patient does not wish to contact the pharmacy document the reason why and proceed with request.) (Agent: If yes, when and what did the pharmacy advise?)Pharmacy need PCP approval and script.   This is the patient's preferred pharmacy:  [facility] - [place], [place] - [address]  [place] [address] Phone: [phone] Fax: [phone]  Is this the correct pharmacy for this prescription? Yes If no, delete pharmacy and type the correct one.   Has the prescription been filled recently? Yes  Is the patient out of the medication? Yes  Has the patient been seen for an appointment in the last year OR does the patient have an upcoming appointment? Yes  Can we respond through MyChart? Yes  Agent: Please be advised that Rx refills may take up to 3 business days. We ask that you follow-up with your pharmacy.

## 2023-10-30 ENCOUNTER — Encounter: Payer: Self-pay | Admitting: Internal Medicine

## 2023-10-30 ENCOUNTER — Ambulatory Visit (INDEPENDENT_AMBULATORY_CARE_PROVIDER_SITE_OTHER): Payer: Self-pay | Admitting: Internal Medicine

## 2023-10-30 VITALS — BP 134/72 | HR 79 | Ht 70.0 in | Wt 254.6 lb

## 2023-10-30 DIAGNOSIS — I10 Essential (primary) hypertension: Secondary | ICD-10-CM | POA: Diagnosis not present

## 2023-10-30 DIAGNOSIS — G4733 Obstructive sleep apnea (adult) (pediatric): Secondary | ICD-10-CM

## 2023-10-30 DIAGNOSIS — R5382 Chronic fatigue, unspecified: Secondary | ICD-10-CM

## 2023-10-30 DIAGNOSIS — E782 Mixed hyperlipidemia: Secondary | ICD-10-CM

## 2023-10-30 DIAGNOSIS — Z114 Encounter for screening for human immunodeficiency virus [HIV]: Secondary | ICD-10-CM

## 2023-10-30 DIAGNOSIS — E669 Obesity, unspecified: Secondary | ICD-10-CM

## 2023-10-30 DIAGNOSIS — R739 Hyperglycemia, unspecified: Secondary | ICD-10-CM

## 2023-10-30 DIAGNOSIS — Z1159 Encounter for screening for other viral diseases: Secondary | ICD-10-CM

## 2023-10-30 DIAGNOSIS — E559 Vitamin D deficiency, unspecified: Secondary | ICD-10-CM

## 2023-10-30 NOTE — Assessment & Plan Note (Addendum)
 BP Readings from Last 1 Encounters:  10/30/23 134/72   Well-controlled with Lisinopril  30 mg QD Counseled for compliance with the medications Advised DASH diet and moderate exercise/walking, at least 150 mins/week Has cut down soft drink intake

## 2023-10-30 NOTE — Assessment & Plan Note (Signed)
 STOP BANG: 6 At high risk for OSA Ordered home sleep study, but was expensive - he prefers to wait for now

## 2023-10-30 NOTE — Assessment & Plan Note (Signed)
 Gained about 15 lbs since the last visit Advised to follow DASH diet and perform moderate exercise/walking at least 150 minutes/week Has cut down soft drink intake

## 2023-10-30 NOTE — Progress Notes (Signed)
 Established Patient Office Visit  Subjective:  Patient ID: Frank Carson, male    DOB: [birth year]  Age: 28 y.o. MRN: [id number]  CC:  Chief Complaint  Patient presents with   Hypertension    6 month f/u     HPI Frank Carson is a 28 y.o. male with past medical history of HTN and obesity who presents for f/u of his chronic medical conditions.  HTN: His BP was wnl in the office today. He takes Lisinopril  30 mg QD.  He denies any headache, dizziness, chest pain, dyspnea or palpitations.  Chronic fatigue: He complains of chronic fatigue and daytime drowsiness.  He reports snoring at nighttime. 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 He was supposed to get blood test done at his workplace clinic, but has not done it yet since the last visit.  He has not had sleep study yet due to cost concern.  Denies any fever, chills, chronic cough, dyspnea, night sweats, weight loss, dysuria or hematuria.   Past Medical History:  Diagnosis Date   Hypertension     Past Surgical History:  Procedure Laterality Date   TONSILLECTOMY      Family History  Problem Relation Age of Onset   Hypertension Father    Hypertension Paternal Grandfather    Heart failure Paternal Grandfather     Social History   Socioeconomic History   Marital status: Single    Spouse name: Not on file   Number of children: Not on file   Years of education: Not on file   Highest education level: Not on file  Occupational History   Not on file  Tobacco Use   Smoking status: Never   Smokeless tobacco: Not on file  Substance and Sexual Activity   Alcohol use: No   Drug use: Not on file   Sexual activity: Not on file  Other Topics Concern   Not on file  Social History Narrative   Not on file   Social Drivers of Health   Financial Resource Strain: Not on file  Food Insecurity: Not on file  Transportation Needs: Not on file  Physical Activity: Not on file   Stress: Not on file  Social Connections: Not on file  Intimate Partner Violence: Not on file    Outpatient Medications Prior to Visit  Medication Sig Dispense Refill   lisinopril  (ZESTRIL ) 30 MG tablet TAKE 1 TABLET (30 MG TOTAL) BY MOUTH DAILY 90 tablet 1   meloxicam  (MOBIC ) 15 MG tablet Take 1 tablet (15 mg total) by mouth daily. 30 tablet 0   omega-3 acid ethyl esters (LOVAZA) 1 g capsule Take 2 capsules by mouth daily.     No facility-administered medications prior to visit.    No Known Allergies  ROS Review of Systems  Constitutional:  Positive for fatigue. Negative for chills and fever.  HENT:  Negative for congestion and sore throat.   Eyes:  Negative for pain and discharge.  Respiratory:  Negative for cough and shortness of breath.   Cardiovascular:  Negative for chest pain and palpitations.  Gastrointestinal:  Negative for constipation, diarrhea, nausea and vomiting.  Endocrine: Negative for polydipsia and polyuria.  Genitourinary:  Negative for dysuria and hematuria.  Musculoskeletal:  Negative for neck pain and neck stiffness.  Skin:  Positive for rash.  Neurological:  Negative for dizziness, weakness, numbness and headaches.  Psychiatric/Behavioral:  Negative for agitation and behavioral problems.  Objective:    Physical Exam Vitals reviewed.  Constitutional:      General: He is not in acute distress.    Appearance: He is obese. He is not diaphoretic.  HENT:     Head: Normocephalic and atraumatic.     Nose: Nose normal.     Mouth/Throat:     Mouth: Mucous membranes are moist.  Eyes:     General: No scleral icterus.    Extraocular Movements: Extraocular movements intact.  Cardiovascular:     Rate and Rhythm: Normal rate and regular rhythm.     Heart sounds: Normal heart sounds. No murmur heard. Pulmonary:     Breath sounds: Normal breath sounds. No wheezing or rales.  Musculoskeletal:     Cervical back: Neck supple. No tenderness.     Right  lower leg: No edema.     Left lower leg: No edema.  Skin:    General: Skin is warm.     Findings: Rash (Patient reported erythematous papules in waist and groin area-recurrent, not currently present) present.  Neurological:     General: No focal deficit present.     Mental Status: He is alert and oriented to person, place, and time.     Sensory: No sensory deficit.     Motor: No weakness.  Psychiatric:        Mood and Affect: Mood normal.        Behavior: Behavior normal.     BP 134/72 (BP Location: Left Arm, Patient Position: Sitting)   Pulse 79   Ht 5' 10 (1.778 m)   Wt 254 lb 9.6 oz (115.5 kg)   SpO2 93%   BMI 36.53 kg/m  Wt Readings from Last 3 Encounters:  10/30/23 254 lb 9.6 oz (115.5 kg)  07/19/23 252 lb 1.9 oz (114.4 kg)  12/26/22 239 lb 12.8 oz (108.8 kg)    No results found for: TSH No results found for: WBC, HGB, HCT, MCV, PLT No results found for: NA, K, CHLORIDE, CO2, GLUCOSE, BUN, CREATININE, BILITOT, ALKPHOS, AST, ALT, PROT, ALBUMIN, CALCIUM, ANIONGAP, EGFR, GFR No results found for: CHOL No results found for: HDL No results found for: LDLCALC No results found for: TRIG No results found for: CHOLHDL No results found for: YHAJ8R    Assessment & Plan:   Problem List Items Addressed This Visit       Cardiovascular and Mediastinum   Essential hypertension - Primary   BP Readings from Last 1 Encounters:  10/30/23 134/72   Well-controlled with Lisinopril  30 mg QD Counseled for compliance with the medications Advised DASH diet and moderate exercise/walking, at least 150 mins/week Has cut down soft drink intake      Relevant Orders   TSH   CMP14+EGFR   CBC with Differential/Platelet     Respiratory   OSA (obstructive sleep apnea)   STOP BANG: 6 At high risk for OSA Ordered home sleep study, but was expensive - he prefers to wait for now        Other   Obesity (BMI 30-39.9)   Gained  about 15 lbs since the last visit Advised to follow DASH diet and perform moderate exercise/walking at least 150 minutes/week Has cut down soft drink intake      Chronic fatigue   Could be due to multiple factors, including lack of sleep, OSA or nutritional deficiency Check CBC, CMP, TSH - gets it at workplace      Other Visit Diagnoses  Mixed hyperlipidemia       Relevant Orders   Lipid panel     Hyperglycemia       Relevant Orders   Hemoglobin A1c     Vitamin D deficiency       Relevant Orders   VITAMIN D 25 Hydroxy (Vit-D Deficiency, Fractures)     Need for hepatitis C screening test       Relevant Orders   Hepatitis C Antibody     Screening for HIV (human immunodeficiency virus)       Relevant Orders   HIV antibody (with reflex)       No orders of the defined types were placed in this encounter.   Follow-up: Return in about 6 months (around 05/01/2024) for Annual physical.    Suzzane MARLA Blanch, MD

## 2023-10-30 NOTE — Patient Instructions (Signed)
Please continue to take medications as prescribed.  Please continue to follow DASH diet and perform moderate exercise/walking at least 150 mins/week.  Please get fasting blood tests done within a week.

## 2023-10-30 NOTE — Assessment & Plan Note (Addendum)
 Could be due to multiple factors, including lack of sleep, OSA or nutritional deficiency Check CBC, CMP, TSH - gets it at workplace

## 2024-05-05 ENCOUNTER — Encounter: Admitting: Internal Medicine
